# Patient Record
Sex: Female | Born: 2000 | Hispanic: Yes | Marital: Single
Health system: Southern US, Community
[De-identification: ages and names within clinical notes are randomized; demographics above are authoritative.]

## PROBLEM LIST (undated history)

## (undated) DIAGNOSIS — D649 Anemia, unspecified: Secondary | ICD-10-CM

## (undated) DIAGNOSIS — F419 Anxiety disorder, unspecified: Secondary | ICD-10-CM

## (undated) HISTORY — DX: Anemia, unspecified: D64.9

## (undated) HISTORY — DX: Anxiety disorder, unspecified: F41.9

---
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
## 2023-06-07 ENCOUNTER — Telehealth: Payer: Self-pay | Admitting: Family Medicine

## 2023-06-08 ENCOUNTER — Other Ambulatory Visit (HOSPITAL_COMMUNITY)
Admission: RE | Admit: 2023-06-08 | Discharge: 2023-06-08 | Disposition: A | Discharge status: Home / Self Care | Payer: Medicaid Other | Source: Ambulatory Visit | Attending: Family Medicine | Admitting: Family Medicine

## 2023-06-08 ENCOUNTER — Encounter: Payer: Self-pay | Admitting: Family Medicine

## 2023-06-08 ENCOUNTER — Ambulatory Visit: Payer: Medicaid Other | Admitting: Family Medicine

## 2023-06-08 VITALS — BP 107/74 | HR 91 | Ht 64.0 in | Wt 113.5 lb

## 2023-06-08 DIAGNOSIS — Z1159 Encounter for screening for other viral diseases: Secondary | ICD-10-CM | POA: Diagnosis not present

## 2023-06-08 DIAGNOSIS — Z113 Encounter for screening for infections with a predominantly sexual mode of transmission: Secondary | ICD-10-CM | POA: Diagnosis not present

## 2023-06-08 DIAGNOSIS — Z0001 Encounter for general adult medical examination with abnormal findings: Secondary | ICD-10-CM

## 2023-06-08 DIAGNOSIS — B3731 Acute candidiasis of vulva and vagina: Secondary | ICD-10-CM | POA: Diagnosis not present

## 2023-06-08 DIAGNOSIS — B9689 Other specified bacterial agents as the cause of diseases classified elsewhere: Secondary | ICD-10-CM | POA: Diagnosis not present

## 2023-06-08 DIAGNOSIS — Z01411 Encounter for gynecological examination (general) (routine) with abnormal findings: Secondary | ICD-10-CM | POA: Diagnosis not present

## 2023-06-08 DIAGNOSIS — N760 Acute vaginitis: Secondary | ICD-10-CM | POA: Diagnosis not present

## 2023-06-08 DIAGNOSIS — F411 Generalized anxiety disorder: Secondary | ICD-10-CM | POA: Diagnosis not present

## 2023-06-08 DIAGNOSIS — D508 Other iron deficiency anemias: Secondary | ICD-10-CM | POA: Insufficient documentation

## 2023-06-08 DIAGNOSIS — Z3009 Encounter for other general counseling and advice on contraception: Secondary | ICD-10-CM

## 2023-06-08 DIAGNOSIS — R0782 Intercostal pain: Secondary | ICD-10-CM

## 2023-06-08 DIAGNOSIS — Z0000 Encounter for general adult medical examination without abnormal findings: Secondary | ICD-10-CM | POA: Diagnosis not present

## 2023-06-08 DIAGNOSIS — Z114 Encounter for screening for human immunodeficiency virus [HIV]: Secondary | ICD-10-CM

## 2023-06-08 DIAGNOSIS — N898 Other specified noninflammatory disorders of vagina: Secondary | ICD-10-CM

## 2023-06-08 LAB — POCT URINE PREGNANCY: Preg Test, Ur: NEGATIVE

## 2023-06-08 MED ORDER — NORELGESTROMIN-ETH ESTRADIOL 150-35 MCG/24HR TD PTWK: MEDICATED_PATCH | TRANSDERMAL | 12 refills | Status: DC

## 2023-06-08 NOTE — Progress Notes (Signed)
New patient visit   Patient: Sharon Frye   DOB: 2000/07/22   22 y.o. Female  MRN: [id number] Visit Date: 06/08/2023  Today's healthcare provider: Sherlyn Hay, DO   Chief Complaint  Patient presents with   New Patient (Initial Visit)    Patient is wanting to wanting to do a phycial exam and possibly discuss her chest pains. Reports she has had always had a random stabbing pain in the sternum area of her chest since 14. She reports that she was always told it is some anxiety and she should just drink some water if she feels it come on suddenly.    Annual Exam    Last completed at 19 years and never completed a pap smear. Patient is sexually active.    Immunizations    Patient has consented to receiving influenza and tetanus vaccine as long as provider approves due to her cold symptoms.    Subjective    Sharon Frye is a 22 y.o. female who presents today as a new patient to establish care.  HPI HPI     New Patient (Initial Visit)    Additional comments: Patient is wanting to wanting to do a phycial exam and possibly discuss her chest pains. Reports she has had always had a random stabbing pain in the sternum area of her chest since 14. She reports that she was always told it is some anxiety and she should just drink some water if she feels it come on suddenly.         Annual Exam    Additional comments: Last completed at 19 years and never completed a pap smear. Patient is sexually active.         Immunizations    Additional comments: Patient has consented to receiving influenza and tetanus vaccine as long as provider approves due to her cold symptoms.       Last edited by Acey Lav, CMA on 06/08/2023  9:11 AM.      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 The primary concern is recurrent chest pain, which has been present since the age of [age] and has been previously attributed to anxiety. The pain is  described as a stabbing sensation in the chest, exacerbated by deep breaths. The patient also reports occasional anxiety, which has improved over time, but can still be triggered by certain situations.  The patient has a history of anemia and is currently taking iron supplements. She also reports a recent abortion earlier this year. The patient has been sexually active and uses condoms for contraception. She has previously tried various forms of birth control, including the pill and the shot, but found the patch to be the most effective. She expresses interest in exploring the patch as a contraceptive option again.  The patient reports recent symptoms of a cold, including sinus congestion, sneezing, and a runny nose. She experienced ear pain the previous day, but it has since resolved. She denies any fever, chills, fatigue, or other systemic symptoms.  The patient also reports a recent issue with vaginal discharge and dry, peeling skin on the labia, which has caused some discomfort and itching. She denies any pain during sex, blood in urine, or other urinary symptoms.  The patient's lifestyle is described as somewhat sedentary due to the nature of her work. Her diet primarily consists of eating out, often at fast-food restaurants, due to a busy work schedule. She acknowledges the need  for healthier dietary choices.  The patient has never been vaccinated for flu or tetanus and has declined the COVID-19 booster shot. She is open to being screened for HIV and Hepatitis C. She also agrees to a Pap smear and testing for sexually transmitted infections.   Past Medical History:  Diagnosis Date   Anemia    Anxiety    History reviewed. No pertinent surgical history. Family Status  Relation Name Status   Mother Weyman Rodney (Not Specified)   Father Sreeya Rossiter (Not Specified)   Pat Aunt Amarylis Mcbride (Not Specified)  No partnership data on file   Family History  Problem Relation Age of Onset    Alcohol abuse Mother    Depression Father    Drug abuse Father    Anxiety disorder Paternal Aunt    Miscarriages / Stillbirths Paternal Aunt    Social History   Socioeconomic History   Marital status: Single    Spouse name: Not on file   Number of children: Not on file   Years of education: Not on file   Highest education level: GED or equivalent  Occupational History   Not on file  Tobacco Use   Smoking status: Never   Smokeless tobacco: Never  Vaping Use   Vaping status: Never Used  Substance and Sexual Activity   Alcohol use: Yes   Drug use: Never   Sexual activity: Yes    Birth control/protection: Condom  Other Topics Concern   Not on file  Social History Narrative   Not on file   Social Determinants of Health   Financial Resource Strain: Low Risk  (06/04/2023)   Overall Financial Resource Strain (CARDIA)    Difficulty of Paying Living Expenses: Not very hard  Food Insecurity: No Food Insecurity (06/04/2023)   Hunger Vital Sign    Worried About Running Out of Food in the Last Year: Never true    Ran Out of Food in the Last Year: Never true  Transportation Needs: No Transportation Needs (06/04/2023)   PRAPARE - Administrator, Civil Service (Medical): No    Lack of Transportation (Non-Medical): No  Physical Activity: Insufficiently Active (06/04/2023)   Exercise Vital Sign    Days of Exercise per Week: 2 days    Minutes of Exercise per Session: 10 min  Stress: Stress Concern Present (06/04/2023)   [employer] of Occupational Health - Occupational Stress Questionnaire    Feeling of Stress : Rather much  Social Connections: Moderately Isolated (06/04/2023)   Social Connection and Isolation Panel [NHANES]    Frequency of Communication with Friends and Family: More than three times a week    Frequency of Social Gatherings with Friends and Family: Once a week    Attends Religious Services: More than 4 times per year    Active Member of [employer] or  Organizations: No    Attends Engineer, structural: Not on file    Marital Status: Never married   Outpatient Medications Prior to Visit  Medication Sig   ibuprofen (ADVIL) 400 MG tablet Take 1 tablet (400 mg total) by mouth every 6 (six) hours as needed.   [DISCONTINUED] Baclofen 5 MG TABS Take 5 mg by mouth 3 (three) times daily as needed. (Patient not taking: Reported on 06/08/2023)   No facility-administered medications prior to visit.   Allergies  Allergen Reactions   Penicillins Rash    Immunization History  Administered Date(s) Administered   DTaP 05/31/2002, 07/31/2002, 10/01/2002, 07/29/2003, 07/13/2006  HIB (PRP-OMP) 05/31/2002, 07/31/2002, 10/01/2002, 07/29/2003   HPV 9-valent 10/17/2012, 12/19/2012, 04/19/2013   Hepatitis A, Ped/Adol-2 Dose 09/16/2005, 07/13/2006   Hepatitis B, PED/ADOLESCENT 26-Dec-2001, 12/31/2002, 05/01/2005   IPV 05/31/2002, 07/31/2002, 12/31/2002   Influenza Nasal 05/15/2010, 06/09/2011, 04/19/2013   Influenza-Unspecified 07/29/2003, 07/13/2006   MMR 04/25/2003, 07/13/2006   Meningococcal B, OMV 05/03/2018, 06/13/2018   Meningococcal Mcv4o 10/31/2013, 05/03/2018   PFIZER(Purple Top)SARS-COV-2 Vaccination 04/28/2020, 05/19/2020   Pneumococcal Conjugate PCV 7 05/31/2002, 07/31/2002, 07/29/2003   Polio, Unspecified 07/13/2006   Tdap 10/17/2012   Varicella 04/25/2003    Health Maintenance  Topic Date Due   DTaP/Tdap/Td (7 - Td or Tdap) 06/22/2023 (Originally 10/18/2022)   INFLUENZA VACCINE  06/22/2023 (Originally 02/17/2023)   COVID-19 Vaccine (3 - 2023-24 season) 06/24/2023 (Originally 03/20/2023)   Cervical Cancer Screening (Pap smear)  06/07/2026   HPV VACCINES  Completed   Hepatitis C Screening  Completed   HIV Screening  Completed    Patient Care Team: Sherlyn Hay, DO as PCP - General (Family Medicine)  Review of Systems  Constitutional:  Negative for chills, fatigue and fever.  HENT:  Positive for congestion, rhinorrhea and  sneezing. Negative for ear pain, postnasal drip and sore throat.   Eyes: Negative.  Negative for pain and redness.  Respiratory:  Negative for cough, shortness of breath and wheezing.   Cardiovascular:  Negative for chest pain and leg swelling.  Gastrointestinal:  Negative for abdominal pain, blood in stool, constipation, diarrhea and nausea.  Endocrine: Negative for polydipsia and polyphagia.  Genitourinary:  Negative for dyspareunia, dysuria, flank pain, hematuria, pelvic pain, vaginal bleeding and vaginal discharge.  Musculoskeletal:  Negative for arthralgias, back pain, gait problem and joint swelling.  Skin:  Negative for rash.  Neurological:  Positive for headaches (yesterday; not an ongoing problem). Negative for dizziness, tremors, seizures, weakness, light-headedness and numbness.  Hematological:  Negative for adenopathy.  Psychiatric/Behavioral:  Negative for behavioral problems, confusion and dysphoric mood. The patient is nervous/anxious. The patient is not hyperactive.         Objective    BP 107/74 (BP Location: Left Arm, Patient Position: Sitting, Cuff Size: Normal)   Pulse 91   Ht 5\' 4"  (1.626 m)   Wt 113 lb 8 oz (51.5 kg)   LMP 05/26/2023   SpO2 100%   BMI 19.48 kg/m     Physical Exam Vitals and nursing note reviewed.  Constitutional:      General: She is awake.     Appearance: Normal appearance.  HENT:     Head: Normocephalic and atraumatic.     Right Ear: Tympanic membrane, ear canal and external ear normal.     Left Ear: Tympanic membrane, ear canal and external ear normal.     Nose: Nose normal.     Mouth/Throat:     Mouth: Mucous membranes are moist.     Pharynx: Oropharynx is clear. No oropharyngeal exudate or posterior oropharyngeal erythema.  Eyes:     General: No scleral icterus.    Extraocular Movements: Extraocular movements intact.     Conjunctiva/sclera: Conjunctivae normal.     Pupils: Pupils are equal, round, and reactive to light.   Neck:     Thyroid: No thyromegaly or thyroid tenderness.  Cardiovascular:     Rate and Rhythm: Normal rate and regular rhythm.     Pulses: Normal pulses.     Heart sounds: Normal heart sounds.  Pulmonary:     Effort: Pulmonary effort is normal. No tachypnea, bradypnea or  respiratory distress.     Breath sounds: Normal breath sounds. No stridor. No wheezing, rhonchi or rales.  Abdominal:     General: Bowel sounds are normal. There is no distension.     Palpations: Abdomen is soft. There is no mass.     Tenderness: There is no abdominal tenderness. There is no guarding.     Hernia: No hernia is present.  Genitourinary:    Exam position: Lithotomy position.     Pubic Area: No rash.      Labia:        Right: No rash, tenderness, lesion or injury.        Left: No rash, tenderness, lesion or injury.      Vagina: Vaginal discharge (copious white discharge) present.     Cervix: Discharge (small amount white discharge from os) and friability present. No cervical motion tenderness, lesion, erythema or cervical bleeding.     Uterus: Normal.      Adnexa: Right adnexa normal and left adnexa normal.     Comments: No malodor noted. Musculoskeletal:     Cervical back: Normal range of motion and neck supple.     Right lower leg: No edema.     Left lower leg: No edema.  Lymphadenopathy:     Cervical: No cervical adenopathy.  Skin:    General: Skin is warm and dry.  Neurological:     Mental Status: She is alert and oriented to person, place, and time. Mental status is at baseline.  Psychiatric:        Mood and Affect: Mood normal.        Behavior: Behavior normal.     Depression Screen    06/08/2023    9:12 AM  PHQ 2/9 Scores  PHQ - 2 Score 0  PHQ- 9 Score 8   Results for orders placed or performed in visit on 06/08/23  Comprehensive metabolic panel  Result Value Ref Range   Glucose 84 70 - 99 mg/dL   BUN 8 6 - 20 mg/dL   Creatinine, Ser 9.62 (L) 0.57 - 1.00 mg/dL   eGFR 952 >84  XL/KGM/0.10   BUN/Creatinine Ratio 15 9 - 23   Sodium 138 134 - 144 mmol/L   Potassium 4.2 3.5 - 5.2 mmol/L   Chloride 102 96 - 106 mmol/L   CO2 21 20 - 29 mmol/L   Calcium 9.4 8.7 - 10.2 mg/dL   Total Protein 7.3 6.0 - 8.5 g/dL   Albumin 4.7 4.0 - 5.0 g/dL   Globulin, Total 2.6 1.5 - 4.5 g/dL   Bilirubin Total 0.3 0.0 - 1.2 mg/dL   Alkaline Phosphatase 52 44 - 121 IU/L   AST 15 0 - 40 IU/L   ALT 11 0 - 32 IU/L  Lipid panel  Result Value Ref Range   Cholesterol, Total 170 100 - 199 mg/dL   Triglycerides 64 0 - 149 mg/dL   HDL 54 >27 mg/dL   VLDL Cholesterol Cal 12 5 - 40 mg/dL   LDL Chol Calc (NIH) 253 (H) 0 - 99 mg/dL   Chol/HDL Ratio 3.1 0.0 - 4.4 ratio  HIV Antibody (routine testing w rflx)  Result Value Ref Range   HIV Screen 4th Generation wRfx Non Reactive Non Reactive  HCV Ab w Reflex to Quant PCR  Result Value Ref Range   HCV Ab Non Reactive Non Reactive  CBC with Differential/Platelet  Result Value Ref Range   WBC 7.8 3.4 - 10.8 x10E3/uL   RBC  4.03 3.77 - 5.28 x10E6/uL   Hemoglobin 12.5 11.1 - 15.9 g/dL   Hematocrit 40.9 81.1 - 46.6 %   MCV 93 79 - 97 fL   MCH 31.0 26.6 - 33.0 pg   MCHC 33.5 31.5 - 35.7 g/dL   RDW 91.4 78.2 - 95.6 %   Platelets 263 150 - 450 x10E3/uL   Neutrophils 75 Not Estab. %   Lymphs 19 Not Estab. %   Monocytes 4 Not Estab. %   Eos 2 Not Estab. %   Basos 0 Not Estab. %   Neutrophils Absolute 5.9 1.4 - 7.0 x10E3/uL   Lymphocytes Absolute 1.5 0.7 - 3.1 x10E3/uL   Monocytes Absolute 0.3 0.1 - 0.9 x10E3/uL   EOS (ABSOLUTE) 0.1 0.0 - 0.4 x10E3/uL   Basophils Absolute 0.0 0.0 - 0.2 x10E3/uL   Immature Granulocytes 0 Not Estab. %   Immature Grans (Abs) 0.0 0.0 - 0.1 x10E3/uL  Interpretation:  Result Value Ref Range   HCV Interp 1: Comment   POCT urine pregnancy  Result Value Ref Range   Preg Test, Ur Negative Negative  Cervicovaginal ancillary only  Result Value Ref Range   Neisseria Gonorrhea Negative    Chlamydia Negative     Trichomonas Negative    Bacterial Vaginitis (gardnerella) Positive (A)    Candida Vaginitis Positive (A)    Candida Glabrata Negative    Comment      Normal Reference Range Bacterial Vaginosis - Negative   Comment Normal Reference Range Candida Species - Negative    Comment Normal Reference Range Candida Galbrata - Negative    Comment Normal Reference Range Trichomonas - Negative    Comment Normal Reference Ranger Chlamydia - Negative    Comment      Normal Reference Range Neisseria Gonorrhea - Negative  Cytology - PAP  Result Value Ref Range   High risk HPV Negative    Adequacy Satisfactory for evaluation.    Diagnosis (A)     - Atypical squamous cells of undetermined significance (ASC-US)   Comment Normal Reference Range HPV - Negative     Assessment & Plan     Encounter for medical examination to establish care Assessment & Plan: Physical exam overall unremarkable except as noted above. Routine lab work ordered as noted. Due for Pap smear and vaccinations (flu and tetanus). Discussed the importance of these screenings and vaccinations. Patient will return for these after recovering from current illness.   - Schedule follow-up appointment in two weeks for vaccinations   - Screen for HIV and hepatitis C   - Perform blood count to check for anemia.  Orders: -     Comprehensive metabolic panel -     Lipid panel  Generalized anxiety disorder Assessment & Plan: Anxiety with improvement over time. No current therapy or medication. Discussed potential benefits of therapy and medication, but patient feels she has it under control. Recommended non-pharmacological interventions such as meditation and yoga.   - Encourage meditation and yoga for anxiety management     Intercostal pain Assessment & Plan: Intermittent chest pain since age 64, likely related to anxiety. Described as a stabbing pain exacerbated by deep breaths, suggestive of costochondritis. Discussed the role of anxiety  in muscle tension and the importance of stretching and deep breathing exercises.   - Encourage meditation and deep breathing exercises   - Recommend stretching exercises to open the chest muscles     Iron deficiency anemia secondary to inadequate dietary iron intake -  CBC with Differential/Platelet  Encounter for counseling regarding contraception Assessment & Plan: Prefers the contraceptive patch, which has been used successfully in the past. Discussed other options including IUD and Nexplanon, but prefers the patch due to previous positive experience and concerns about implants. Discussed the IUD's long-term benefits and potential cramping/bleeding with both IUD and Nexplanon. Explained that the IUD is approved for nulliparous individuals and lasts up to 8 years.   - Prescribe contraceptive patch   - Confirm insurance coverage for the patch    Orders: -     POCT urine pregnancy -     Norelgestromin-Eth Estradiol; Place 1 patch onto the skin and leave in place for 7 days.  Change patch every 7 days for 21 days. Then go without a patch for 7 days.  Repeat every 28 days.  Dispense: 3 patch; Refill: 12  Encounter for screening for HIV -     HIV Antibody (routine testing w rflx)  Encounter for hepatitis C screening test for low risk patient -     HCV Ab w Reflex to Quant PCR -     Interpretation:  Encounter for well woman exam with abnormal findings -     Cervicovaginal ancillary only -     Cytology - PAP  Screening examination for sexually transmitted disease -     Cytology - PAP  Vaginal discharge  Vagina, candidiasis -     Cervicovaginal ancillary only    Return in about 2 weeks (around 06/22/2023) for Vaccine admin only; may add slot to allow for visit.     I discussed the assessment and treatment plan with the patient  The patient was provided an opportunity to ask questions and all were answered. The patient agreed with the plan and demonstrated an understanding of the  instructions.   The patient was advised to call back or seek an in-person evaluation if the symptoms worsen or if the condition fails to improve as anticipated.    Sherlyn Hay, DO  [facility] [phone] (phone) [phone] (fax)  [facility]

## 2023-06-09 LAB — CERVICOVAGINAL ANCILLARY ONLY
Bacterial Vaginitis (gardnerella): POSITIVE — AB
Candida Glabrata: NEGATIVE
Candida Vaginitis: POSITIVE — AB
Chlamydia: NEGATIVE
Comment: NEGATIVE
Comment: NEGATIVE
Comment: NEGATIVE
Comment: NEGATIVE
Comment: NEGATIVE
Comment: NORMAL
Neisseria Gonorrhea: NEGATIVE
Trichomonas: NEGATIVE

## 2023-06-09 LAB — CBC WITH DIFFERENTIAL/PLATELET
Basophils Absolute: 0 10*3/uL (ref 0.0–0.2)
Basos: 0 %
EOS (ABSOLUTE): 0.1 10*3/uL (ref 0.0–0.4)
Eos: 2 %
Hematocrit: 37.3 % (ref 34.0–46.6)
Hemoglobin: 12.5 g/dL (ref 11.1–15.9)
Immature Grans (Abs): 0 10*3/uL (ref 0.0–0.1)
Immature Granulocytes: 0 %
Lymphocytes Absolute: 1.5 10*3/uL (ref 0.7–3.1)
Lymphs: 19 %
MCH: 31 pg (ref 26.6–33.0)
MCHC: 33.5 g/dL (ref 31.5–35.7)
MCV: 93 fL (ref 79–97)
Monocytes Absolute: 0.3 10*3/uL (ref 0.1–0.9)
Monocytes: 4 %
Neutrophils Absolute: 5.9 10*3/uL (ref 1.4–7.0)
Neutrophils: 75 %
Platelets: 263 10*3/uL (ref 150–450)
RBC: 4.03 x10E6/uL (ref 3.77–5.28)
RDW: 11.9 % (ref 11.7–15.4)
WBC: 7.8 10*3/uL (ref 3.4–10.8)

## 2023-06-09 LAB — COMPREHENSIVE METABOLIC PANEL
ALT: 11 [IU]/L (ref 0–32)
AST: 15 [IU]/L (ref 0–40)
Albumin: 4.7 g/dL (ref 4.0–5.0)
Alkaline Phosphatase: 52 [IU]/L (ref 44–121)
BUN/Creatinine Ratio: 15 (ref 9–23)
BUN: 8 mg/dL (ref 6–20)
Bilirubin Total: 0.3 mg/dL (ref 0.0–1.2)
CO2: 21 mmol/L (ref 20–29)
Calcium: 9.4 mg/dL (ref 8.7–10.2)
Chloride: 102 mmol/L (ref 96–106)
Creatinine, Ser: 0.54 mg/dL — ABNORMAL LOW (ref 0.57–1.00)
Globulin, Total: 2.6 g/dL (ref 1.5–4.5)
Glucose: 84 mg/dL (ref 70–99)
Potassium: 4.2 mmol/L (ref 3.5–5.2)
Sodium: 138 mmol/L (ref 134–144)
Total Protein: 7.3 g/dL (ref 6.0–8.5)
eGFR: 134 mL/min/{1.73_m2} (ref >59)

## 2023-06-09 LAB — LIPID PANEL
Chol/HDL Ratio: 3.1 ratio (ref 0.0–4.4)
Cholesterol, Total: 170 mg/dL (ref 100–199)
HDL: 54 mg/dL (ref >39)
LDL Chol Calc (NIH): 104 mg/dL — ABNORMAL HIGH (ref 0–99)
Triglycerides: 64 mg/dL (ref 0–149)
VLDL Cholesterol Cal: 12 mg/dL (ref 5–40)

## 2023-06-09 LAB — HCV INTERPRETATION

## 2023-06-09 LAB — HIV ANTIBODY (ROUTINE TESTING W REFLEX): HIV Screen 4th Generation wRfx: NONREACTIVE

## 2023-06-09 LAB — HCV AB W REFLEX TO QUANT PCR: HCV Ab: NONREACTIVE

## 2023-06-14 LAB — CYTOLOGY - PAP
Comment: NEGATIVE
Diagnosis: UNDETERMINED — AB
High risk HPV: NEGATIVE

## 2023-06-21 ENCOUNTER — Encounter: Payer: Self-pay | Admitting: Family Medicine

## 2023-06-21 DIAGNOSIS — R0782 Intercostal pain: Secondary | ICD-10-CM | POA: Insufficient documentation

## 2023-06-21 MED ORDER — METRONIDAZOLE 500 MG PO TABS: 500.0000 mg | ORAL_TABLET | Freq: Two times a day (BID) | ORAL | 0 refills | Status: DC

## 2023-06-21 MED ORDER — FLUCONAZOLE 100 MG PO TABS: 100.0000 mg | ORAL_TABLET | Freq: Every day | ORAL | 0 refills | Status: DC

## 2023-06-21 NOTE — Assessment & Plan Note (Signed)
Anxiety with improvement over time. No current therapy or medication. Discussed potential benefits of therapy and medication, but patient feels she has it under control. Recommended non-pharmacological interventions such as meditation and yoga.   - Encourage meditation and yoga for anxiety management

## 2023-06-21 NOTE — Assessment & Plan Note (Signed)
Intermittent chest pain since age 22, likely related to anxiety. Described as a stabbing pain exacerbated by deep breaths, suggestive of costochondritis. Discussed the role of anxiety in muscle tension and the importance of stretching and deep breathing exercises.   - Encourage meditation and deep breathing exercises   - Recommend stretching exercises to open the chest muscles

## 2023-06-21 NOTE — Assessment & Plan Note (Addendum)
Physical exam overall unremarkable except as noted above. Routine lab work ordered as noted. Due for Pap smear and vaccinations (flu and tetanus). Discussed the importance of these screenings and vaccinations. Patient will return for these after recovering from current illness.   - Schedule follow-up appointment in two weeks for vaccinations   - Screen for HIV and hepatitis C   - Perform blood count to check for anemia.

## 2023-06-21 NOTE — Assessment & Plan Note (Signed)
Prefers the contraceptive patch, which has been used successfully in the past. Discussed other options including IUD and Nexplanon, but prefers the patch due to previous positive experience and concerns about implants. Discussed the IUD's long-term benefits and potential cramping/bleeding with both IUD and Nexplanon. Explained that the IUD is approved for nulliparous individuals and lasts up to 8 years.   - Prescribe contraceptive patch   - Confirm insurance coverage for the patch

## 2023-06-21 NOTE — Addendum Note (Signed)
Addended by: Jacquenette Shone on: 06/21/2023 03:47 PM   Modules accepted: Orders

## 2023-06-22 ENCOUNTER — Ambulatory Visit (INDEPENDENT_AMBULATORY_CARE_PROVIDER_SITE_OTHER): Payer: Medicaid Other | Admitting: Family Medicine

## 2023-06-22 ENCOUNTER — Ambulatory Visit: Payer: Medicaid Other | Admitting: Family Medicine

## 2023-06-22 ENCOUNTER — Encounter: Payer: Self-pay | Admitting: Family Medicine

## 2023-06-22 VITALS — BP 111/77 | HR 85 | Wt 114.0 lb

## 2023-06-22 DIAGNOSIS — Z23 Encounter for immunization: Secondary | ICD-10-CM | POA: Diagnosis not present

## 2023-06-22 DIAGNOSIS — F419 Anxiety disorder, unspecified: Secondary | ICD-10-CM

## 2023-06-22 DIAGNOSIS — F411 Generalized anxiety disorder: Secondary | ICD-10-CM | POA: Diagnosis not present

## 2023-06-22 NOTE — Assessment & Plan Note (Addendum)
Pt requesting referral to ambulatory psychiatry for cognitive behavioral therapy as she discussed with her PCP, Dr. Payton Mccallum. Last GAD 7 was 18 (severe anxiety) - no emergent needs today per pt.   Discussed options for medications as well, pt would like to hold off and first start with therapy.   Order placed.

## 2023-06-22 NOTE — Progress Notes (Signed)
Established Patient Office Visit  Introduced to nurse practitioner role and practice setting.  All questions answered.  Discussed provider/patient relationship and expectations.  Subjective   Patient ID: Sharon Frye, female    DOB: 04/04/2001  Age: 22 y.o. MRN: [id number]  Chief Complaint  Patient presents with   Immunizations    Flu and Tetanus    Pt presents to clinic for Flu and Tdap vaccines. Was seen by PCP, Dr. Payton Mccallum, two weeks ago - held off on vaccines given cold symptoms at the time. Administering today. Pt also interested in getting referral for cognitive behavioral therapy, as discussed during her last visit.   Denies any suicidal, homicidal, or emergent behavioral health needs today.        06/08/2023    9:12 AM  Depression screen PHQ 2/9  Decreased Interest 0  Down, Depressed, Hopeless 0  PHQ - 2 Score 0  Altered sleeping 1  Tired, decreased energy 1  Change in appetite 2  Feeling bad or failure about yourself  1  Trouble concentrating 2  Moving slowly or fidgety/restless 1  Suicidal thoughts 0  PHQ-9 Score 8  Difficult doing work/chores Somewhat difficult       06/08/2023    9:12 AM  GAD 7 : Generalized Anxiety Score  Nervous, Anxious, on Edge 2  Control/stop worrying 3  Worry too much - different things 3  Trouble relaxing 2  Restless 2  Easily annoyed or irritable 3  Afraid - awful might happen 3  Total GAD 7 Score 18  Anxiety Difficulty Somewhat difficult   Review of Systems  All other systems reviewed and are negative.    Objective:     BP 111/77   Pulse 85   Wt 114 lb (51.7 kg)   LMP 05/26/2023 (Approximate)   SpO2 100%   BMI 19.57 kg/m    Physical Exam Constitutional:      Appearance: Normal appearance. She is normal weight.  HENT:     Head: Normocephalic.  Cardiovascular:     Rate and Rhythm: Normal rate and regular rhythm.     Pulses: Normal pulses.     Heart sounds: Normal heart sounds.  Pulmonary:     Effort:  Pulmonary effort is normal.     Breath sounds: Normal breath sounds.  Skin:    Capillary Refill: Capillary refill takes less than 2 seconds.  Neurological:     General: No focal deficit present.     Mental Status: She is alert and oriented to person, place, and time. Mental status is at baseline.  Psychiatric:        Mood and Affect: Mood normal.        Behavior: Behavior normal.        Thought Content: Thought content normal.        Judgment: Judgment normal.    Flowsheet Row Office Visit from 06/08/2023 in [facility]  PHQ-9 Total Score 8          06/08/2023    9:12 AM  GAD 7 : Generalized Anxiety Score  Nervous, Anxious, on Edge 2  Control/stop worrying 3  Worry too much - different things 3  Trouble relaxing 2  Restless 2  Easily annoyed or irritable 3  Afraid - awful might happen 3  Total GAD 7 Score 18  Anxiety Difficulty Somewhat difficult     No results found for any visits on 06/22/23.      Assessment & Plan:  Generalized  anxiety disorder Assessment & Plan: Pt requesting referral to ambulatory psychiatry for cognitive behavioral therapy. Last GAD 7 was 18 (severe anxiety).   Discussed options for medications as well, pt would like to hold off and first start with therapy.   Order placed.    Need for Tdap vaccination -     Tdap vaccine greater than or equal to 7yo IM  Need for influenza vaccination -     Flu vaccine trivalent PF, 6mos and older(Flulaval,Afluria,Fluarix,Fluzone)  Anxiety -     Ambulatory referral to Psychiatry      Return if symptoms worsen or fail to improve, for annual physical.   I, Sallee Provencal, FNP, have reviewed all documentation for this visit. The documentation on 06/22/23 for the exam, diagnosis, procedures, and orders are all accurate and complete.   Sallee Provencal, FNP

## 2023-08-31 ENCOUNTER — Encounter: Payer: Self-pay | Admitting: Family Medicine

## 2023-09-02 ENCOUNTER — Ambulatory Visit: Payer: Self-pay

## 2023-09-02 NOTE — Telephone Encounter (Signed)
  Chief Complaint: Thinks she may have flu - wants flu testing Symptoms: as below Frequency: yesterday Pertinent Negatives: Patient denies  Disposition: [] ED /[] Urgent Care (no appt availability in office) / [] Appointment(In office/virtual)/ []  [facility]/ [x] Home Care/ [] Refused Recommended Disposition /[]  Mobile Bus/ []  Follow-up with PCP Additional Notes: Pt thinks she may have the flu and desires testing. Pt will go to pharmacy and p/u flu/covid test. Pt will call back after testing for further advice. Pt may wish to start antiviral medications.   Summary: Fever, runny nose, cough   Patient states that she has had a fever, runny nose, cough and ear ache for 1 day. Patient is requesting a flu test before she goes to work at 11 this morning. Please advise.     Reason for Disposition  Influenza and antiviral drugs, questions about  Answer Assessment - Initial Assessment Questions 1. TYPE of EXPOSURE: "How were you exposed?" (e.g., close contact, not a close contact)     S/S came on suddenly yesterday. Pt is seeking flu test.  5. SYMPTOMS: "Do you have any symptoms?" (e.g., cough, fever, sore throat, difficulty breathing).     Fever, ear ache, runny nose.  Protocols used: Influenza Exposure-A-AH

## 2023-10-20 ENCOUNTER — Other Ambulatory Visit: Payer: Self-pay

## 2023-10-20 ENCOUNTER — Ambulatory Visit
Admission: RE | Admit: 2023-10-20 | Discharge: 2023-10-20 | Disposition: A | Discharge status: Home / Self Care | Source: Ambulatory Visit | Attending: Emergency Medicine | Admitting: Emergency Medicine

## 2023-10-20 VITALS — BP 114/79 | HR 75 | Temp 99.7°F | Resp 16

## 2023-10-20 DIAGNOSIS — S31139A Puncture wound of abdominal wall without foreign body, unspecified quadrant without penetration into peritoneal cavity, initial encounter: Secondary | ICD-10-CM | POA: Diagnosis not present

## 2023-10-20 DIAGNOSIS — S30861A Insect bite (nonvenomous) of abdominal wall, initial encounter: Secondary | ICD-10-CM | POA: Diagnosis not present

## 2023-10-20 DIAGNOSIS — W57XXXA Bitten or stung by nonvenomous insect and other nonvenomous arthropods, initial encounter: Secondary | ICD-10-CM

## 2023-10-20 MED ORDER — TRIAMCINOLONE ACETONIDE 0.1 % EX CREA: 1.0000 | TOPICAL_CREAM | Freq: Two times a day (BID) | CUTANEOUS | 0 refills | Status: DC

## 2023-10-20 NOTE — Discharge Instructions (Signed)
 Your evaluated for your insect bite which appears to be an allergic reaction but at this time no signs of infection  Apply triamcinolone cream twice daily to the affected area until healed  Please monitor for signs of infection such as worsening redness, pain, drainage or fever

## 2023-10-20 NOTE — ED Triage Notes (Signed)
 Patient presents to [facility] for evaluation after she felt something bite her on her left abdomen.  She looked down and there was a tick on the couch, but not attached to her.  Ever since, she has had redness, itchiness to the area.

## 2023-10-20 NOTE — ED Provider Notes (Signed)
 Renaldo Fiddler    CSN: [id number] Arrival date & time: 10/20/23  1159      History   Chief Complaint Chief Complaint  Patient presents with   Insect Bite    HPI Sharon Frye is a 23 y.o. female.   She presents for evaluation of erythema and pruritus present to the left flank after tick bite occurring 2 days ago.  Able to remove tick in its entirety.  Area mildly swollen.  Denies drainage and fever.   Past Medical History:  Diagnosis Date   Anemia    Anxiety     Patient Active Problem List   Diagnosis Date Noted   Intercostal pain 06/21/2023   Encounter for medical examination to establish care 06/08/2023   Generalized anxiety disorder 06/08/2023   Iron deficiency anemia secondary to inadequate dietary iron intake 06/08/2023   Encounter for counseling regarding contraception 06/08/2023    History reviewed. No pertinent surgical history.  OB History   No obstetric history on file.      Home Medications    Prior to Admission medications   Medication Sig Start Date End Date Taking? Authorizing Provider  triamcinolone cream (KENALOG) 0.1 % Apply 1 Application topically 2 (two) times daily. 10/20/23  Yes Charlesa Ehle R, NP  fluconazole (DIFLUCAN) 100 MG tablet Take 1 tablet (100 mg total) by mouth daily. WAIT and take AFTER completing metronidazole. Patient not taking: Reported on 06/22/2023 06/21/23   Sherlyn Hay, DO  ibuprofen (ADVIL) 400 MG tablet Take 1 tablet (400 mg total) by mouth every 6 (six) hours as needed. Patient not taking: Reported on 06/22/2023 04/11/20   Enid Derry, PA-C  metroNIDAZOLE (FLAGYL) 500 MG tablet Take 1 tablet (500 mg total) by mouth 2 (two) times daily. Patient not taking: Reported on 06/22/2023 06/21/23   Sherlyn Hay, DO  norelgestromin-ethinyl estradiol [employer]) 150-35 MCG/24HR transdermal patch Place 1 patch onto the skin and leave in place for 7 days.  Change patch every 7 days for 21 days. Then go without a patch for 7  days.  Repeat every 28 days. 06/08/23   Sherlyn Hay, DO    Family History Family History  Problem Relation Age of Onset   Alcohol abuse Mother    Depression Father    Drug abuse Father    Anxiety disorder Paternal Aunt    Miscarriages / Stillbirths Paternal Aunt     Social History Social History   Tobacco Use   Smoking status: Never   Smokeless tobacco: Never  Vaping Use   Vaping status: Never Used  Substance Use Topics   Alcohol use: Yes   Drug use: Never     Allergies   Penicillins   Review of Systems Review of Systems   Physical Exam Triage Vital Signs ED Triage Vitals [10/20/23 1210]  Encounter Vitals Group     BP 114/79     Systolic BP Percentile      Diastolic BP Percentile      Pulse Rate 75     Resp 16     Temp 99.7 F (37.6 C)     Temp Source Oral     SpO2 98 %     Weight      Height      Head Circumference      Peak Flow      Pain Score 0     Pain Loc      Pain Education      Exclude from [employer]  Chart    No data found.  Updated Vital Signs BP 114/79 (BP Location: Left Arm)   Pulse 75   Temp 99.7 F (37.6 C) (Oral)   Resp 16   LMP 09/29/2023   SpO2 98%   Visual Acuity Right Eye Distance:   Left Eye Distance:   Bilateral Distance:    Right Eye Near:   Left Eye Near:    Bilateral Near:     Physical Exam Constitutional:      Appearance: Normal appearance.  Eyes:     Extraocular Movements: Extraocular movements intact.  Pulmonary:     Effort: Pulmonary effort is normal.  Skin:    Comments: Less than 0.5 cm puncture present to the left flank with surrounding erythema, nontender, no drainage noted  Neurological:     Mental Status: She is alert and oriented to person, place, and time. Mental status is at baseline.      UC Treatments / Results  Labs (all labs ordered are listed, but only abnormal results are displayed) Labs Reviewed - No data to display  EKG   Radiology No results  found.  Procedures Procedures (including critical care time)  Medications Ordered in UC Medications - No data to display  Initial Impression / Assessment and Plan / UC Course  I have reviewed the triage vital signs and the nursing notes.  Pertinent labs & imaging results that were available during my care of the patient were reviewed by me and considered in my medical decision making (see chart for details).  Puncture wound of abdomen, initial encounter, tick bite of left flank, initial encounter  No signs of infection, appears to be inflammatory reaction, will defer oral steroids at this time, prescribed triamcinolone cream and recommended topical or oral antihistamines for further management of itching, advised against long exposure to heat, may follow-up as needed Final Clinical Impressions(s) / UC Diagnoses   Final diagnoses:  Tick bite of left flank, initial encounter  Puncture wound of abdomen, initial encounter     Discharge Instructions      Your evaluated for your insect bite which appears to be an allergic reaction but at this time no signs of infection  Apply triamcinolone cream twice daily to the affected area until healed  Please monitor for signs of infection such as worsening redness, pain, drainage or fever     ED Prescriptions     Medication Sig Dispense Auth. Provider   triamcinolone cream (KENALOG) 0.1 % Apply 1 Application topically 2 (two) times daily. 30 g Valinda Hoar, NP      PDMP not reviewed this encounter.   Valinda Hoar, [place] 10/20/23 [phone]

## 2024-07-16 ENCOUNTER — Encounter: Payer: Self-pay | Admitting: Family Medicine

## 2024-07-16 ENCOUNTER — Ambulatory Visit (INDEPENDENT_AMBULATORY_CARE_PROVIDER_SITE_OTHER): Admitting: Family Medicine

## 2024-07-16 ENCOUNTER — Other Ambulatory Visit (HOSPITAL_COMMUNITY)
Admission: RE | Admit: 2024-07-16 | Discharge: 2024-07-16 | Disposition: A | Discharge status: Home / Self Care | Source: Ambulatory Visit | Attending: Family Medicine | Admitting: Family Medicine

## 2024-07-16 VITALS — BP 107/74 | HR 82 | Temp 98.6°F | Ht 64.0 in | Wt 123.6 lb

## 2024-07-16 DIAGNOSIS — R002 Palpitations: Secondary | ICD-10-CM | POA: Diagnosis not present

## 2024-07-16 DIAGNOSIS — Z23 Encounter for immunization: Secondary | ICD-10-CM

## 2024-07-16 DIAGNOSIS — R079 Chest pain, unspecified: Secondary | ICD-10-CM

## 2024-07-16 DIAGNOSIS — R202 Paresthesia of skin: Secondary | ICD-10-CM | POA: Diagnosis not present

## 2024-07-16 DIAGNOSIS — F411 Generalized anxiety disorder: Secondary | ICD-10-CM | POA: Diagnosis not present

## 2024-07-16 DIAGNOSIS — Z118 Encounter for screening for other infectious and parasitic diseases: Secondary | ICD-10-CM

## 2024-07-16 DIAGNOSIS — Z136 Encounter for screening for cardiovascular disorders: Secondary | ICD-10-CM

## 2024-07-16 MED ORDER — ESCITALOPRAM OXALATE 10 MG PO TABS: 10.0000 mg | ORAL_TABLET | Freq: Every day | ORAL | 1 refills | Status: AC

## 2024-07-16 MED ORDER — HYDROXYZINE HCL 10 MG PO TABS: 10.0000 mg | ORAL_TABLET | Freq: Three times a day (TID) | ORAL | 0 refills | Status: AC | PRN

## 2024-07-16 NOTE — Progress Notes (Addendum)
 "     Established patient visit   Patient: Sharon Frye   DOB: 06/05/2001   23 y.o. Female  MRN: [id number] Visit Date: 07/16/2024  Today's healthcare provider: LAURAINE LOISE BUOY, DO   Chief Complaint  Patient presents with   Acute Visit    Patient states that she is having chest pains within the last few months, seems like it is happening 2-3 times a week.  Feels like that her heart/chest is getting tighter each time she breaths in, discomfort feeling like her heart is going to explode.  States that she isn't doing anything specific but when it does happen she feels as though she can't move and has to get her breathing right.  That's the only way she can get her chest to stop hurting is by taking deep breaths.  Also numbness in right leg.   Subjective    HPI Sharon Frye is a 23 year old female with a history of anxiety who presents with chest pain and associated anxiety symptoms.  She experiences chest pain two to three times a week for the last couple of months, typically located in the middle of her chest (over the sternum). Deep breaths help alleviate the pain. During these episodes, she has difficulty breathing, describing it as feeling like she 'can't breathe' and needing to take 'real small' breaths.   Her chest pain often coincides with periods of increased anxiety. She has a history of anxiety and was previously referred to psychiatry but did not attend.  She reports numbness in her right leg occurring three times, typically when standing up or bending down. She believes this is separate from her chest pain. No back pain is noted.  No symptoms of coughing, sneezing, runny nose, sore throat, or itchy eyes. She denies any family history of early heart problems but mentions a family history of diabetes, specifically her mother.      Medications: Show/hide medication list[1]  Review of Systems  HENT:  Negative for congestion, rhinorrhea and sore throat.   Respiratory:   Positive for shortness of breath. Negative for cough.   Cardiovascular:  Positive for chest pain. Negative for palpitations.        Objective    BP 107/74 (BP Location: Right Arm, Patient Position: Sitting, Cuff Size: Normal)   Pulse 82   Temp 98.6 F (37 C) (Oral)   Ht 5' 4 (1.626 m)   Wt 123 lb 9.6 oz (56.1 kg)   SpO2 100%   BMI 21.22 kg/m     Physical Exam Constitutional:      Appearance: Normal appearance.  HENT:     Head: Normocephalic and atraumatic.  Eyes:     General: No scleral icterus.    Extraocular Movements: Extraocular movements intact.     Conjunctiva/sclera: Conjunctivae normal.  Cardiovascular:     Rate and Rhythm: Normal rate and regular rhythm.     Pulses: Normal pulses.     Heart sounds: Normal heart sounds.  Pulmonary:     Effort: Pulmonary effort is normal. No respiratory distress.     Breath sounds: Normal breath sounds.  Abdominal:     General: Bowel sounds are normal. There is no distension.     Palpations: Abdomen is soft. There is no mass.     Tenderness: There is no abdominal tenderness. There is no guarding.  Musculoskeletal:     Right lower leg: No edema.     Left lower leg: No edema.  Skin:  General: Skin is warm and dry.  Neurological:     Mental Status: She is alert and oriented to person, place, and time. Mental status is at baseline.  Psychiatric:        Mood and Affect: Mood normal.        Behavior: Behavior normal.      No results found for any visits on 07/16/24.  Assessment & Plan    Chest pain, unspecified type -     EKG 12-Lead -     Comprehensive metabolic panel with GFR -     TSH Rfx on Abnormal to Free T4 -     CBC -     Troponin T  Palpitation -     TSH Rfx on Abnormal to Free T4  Generalized anxiety disorder -     Ambulatory referral to Psychology -     Escitalopram  Oxalate; Take 1 tablet (10 mg total) by mouth daily.  Dispense: 30 tablet; Refill: 1 -     hydrOXYzine  HCl; Take 1 tablet (10 mg total)  by mouth 3 (three) times daily as needed.  Dispense: 30 tablet; Refill: 0  Paresthesia -     VITAMIN D  25 Hydroxy (Vit-D Deficiency, Fractures) -     Vitamin B12  Encounter for screening for cardiovascular disorders -     Lipid panel  Need for influenza vaccination -     Flu vaccine trivalent PF, 6mos and older(Flulaval,Afluria,Fluarix,Fluzone)  Screening for chlamydial disease -     Cervicovaginal ancillary only     Chest pain, unspecified type; palpitation Intermittent chest pain likely anxiety-related given normal EKG and young age. - EKG showed normal sinus rhythm without ST-elevation, ectopy or ST changes, with normal axis. - Ordered blood work including cardiac markers to rule out other causes. - Will check troponin T to further rule out cardiac source of pain. - Address anxiety today as a suspected cause of chest pain.  Generalized anxiety disorder Anxiety likely contributing to chest pain. Discussed medication options and importance of communication regarding adverse effects. - Prescribed low dose daily medication for anxiety (escitalopram  10 mg daily). - Prescribed hydroxyzine  10 mg 3 times daily as needed for acute anxiety episodes. - Referred to counseling for anxiety management.  Paresthesia Intermittent numbness in right leg, considered separate from chest pain.  Denies back pain.  Will evaluate for metabolic sources with blood work as noted.  General Health Maintenance Routine health maintenance discussed. Family history of diabetes noted. - Screen for diabetes using blood sugar on metabolic panel. - Performed self-swab for chlamydia screening. - Plan for Pap smear at next visit.    Return in about 6 weeks (around 08/27/2024) for Pap, Anx/Dep.      I discussed the assessment and treatment plan with the patient  The patient was provided an opportunity to ask questions and all were answered. The patient agreed with the plan and demonstrated an understanding of  the instructions.   The patient was advised to call back or seek an in-person evaluation if the symptoms worsen or if the condition fails to improve as anticipated.    LAURAINE LOISE BUOY, DO  [facility] [phone] (phone) [phone] (fax)  [facility]     [1]  Outpatient Medications Prior to Visit  Medication Sig   [DISCONTINUED] fluconazole  (DIFLUCAN ) 100 MG tablet Take 1 tablet (100 mg total) by mouth daily. WAIT and take AFTER completing metronidazole . (Patient not taking: Reported on 06/22/2023)   [DISCONTINUED]  ibuprofen  (ADVIL ) 400 MG tablet Take 1 tablet (400 mg total) by mouth every 6 (six) hours as needed. (Patient not taking: Reported on 06/22/2023)   [DISCONTINUED] metroNIDAZOLE  (FLAGYL ) 500 MG tablet Take 1 tablet (500 mg total) by mouth 2 (two) times daily. (Patient not taking: Reported on 06/22/2023)   [DISCONTINUED] norelgestromin -ethinyl estradiol  (XULANE) 150-35 MCG/24HR transdermal patch Place 1 patch onto the skin and leave in place for 7 days.  Change patch every 7 days for 21 days. Then go without a patch for 7 days.  Repeat every 28 days.   [DISCONTINUED] triamcinolone  cream (KENALOG ) 0.1 % Apply 1 Application topically 2 (two) times daily.   No facility-administered medications prior to visit.   "

## 2024-07-17 LAB — COMPREHENSIVE METABOLIC PANEL WITH GFR
ALT: 45 IU/L — ABNORMAL HIGH (ref 0–32)
AST: 26 IU/L (ref 0–40)
Albumin: 4.6 g/dL (ref 4.0–5.0)
Alkaline Phosphatase: 46 IU/L (ref 41–116)
BUN/Creatinine Ratio: 21 (ref 9–23)
BUN: 10 mg/dL (ref 6–20)
Bilirubin Total: 0.3 mg/dL (ref 0.0–1.2)
CO2: 20 mmol/L (ref 20–29)
Calcium: 9.2 mg/dL (ref 8.7–10.2)
Chloride: 104 mmol/L (ref 96–106)
Creatinine, Ser: 0.47 mg/dL — ABNORMAL LOW (ref 0.57–1.00)
Globulin, Total: 2.4 g/dL (ref 1.5–4.5)
Glucose: 87 mg/dL (ref 70–99)
Potassium: 4.2 mmol/L (ref 3.5–5.2)
Sodium: 140 mmol/L (ref 134–144)
Total Protein: 7 g/dL (ref 6.0–8.5)
eGFR: 138 mL/min/1.73

## 2024-07-17 LAB — LIPID PANEL
Chol/HDL Ratio: 4.4 ratio (ref 0.0–4.4)
Cholesterol, Total: 192 mg/dL (ref 100–199)
HDL: 44 mg/dL
LDL Chol Calc (NIH): 134 mg/dL — ABNORMAL HIGH (ref 0–99)
Triglycerides: 76 mg/dL (ref 0–149)
VLDL Cholesterol Cal: 14 mg/dL (ref 5–40)

## 2024-07-17 LAB — CERVICOVAGINAL ANCILLARY ONLY
Chlamydia: NEGATIVE
Comment: NEGATIVE
Comment: NORMAL
Neisseria Gonorrhea: NEGATIVE

## 2024-07-17 LAB — CBC
Hematocrit: 38.1 % (ref 34.0–46.6)
Hemoglobin: 12.8 g/dL (ref 11.1–15.9)
MCH: 30.5 pg (ref 26.6–33.0)
MCHC: 33.6 g/dL (ref 31.5–35.7)
MCV: 91 fL (ref 79–97)
Platelets: 268 x10E3/uL (ref 150–450)
RBC: 4.2 x10E6/uL (ref 3.77–5.28)
RDW: 12 % (ref 11.7–15.4)
WBC: 6.3 x10E3/uL (ref 3.4–10.8)

## 2024-07-17 LAB — TSH RFX ON ABNORMAL TO FREE T4: TSH: 1.3 u[IU]/mL (ref 0.450–4.500)

## 2024-07-17 LAB — VITAMIN B12: Vitamin B-12: 489 pg/mL (ref 232–1245)

## 2024-07-17 LAB — VITAMIN D 25 HYDROXY (VIT D DEFICIENCY, FRACTURES): Vit D, 25-Hydroxy: 13 ng/mL — ABNORMAL LOW (ref 30.0–100.0)

## 2024-07-17 LAB — TROPONIN T: Troponin T (Highly Sensitive): <6 ng/L (ref 0–14)

## 2024-07-28 ENCOUNTER — Ambulatory Visit: Payer: Self-pay | Admitting: Family Medicine

## 2024-07-28 DIAGNOSIS — E559 Vitamin D deficiency, unspecified: Secondary | ICD-10-CM

## 2024-07-28 MED ORDER — VITAMIN D (ERGOCALCIFEROL) 1.25 MG (50000 UNIT) PO CAPS: 50000.0000 [IU] | ORAL_CAPSULE | ORAL | 1 refills | Status: AC

## 2024-08-03 ENCOUNTER — Ambulatory Visit
Admission: EM | Admit: 2024-08-03 | Discharge: 2024-08-03 | Disposition: A | Discharge status: Home / Self Care | Attending: Emergency Medicine | Admitting: Emergency Medicine

## 2024-08-03 ENCOUNTER — Encounter: Payer: Self-pay | Admitting: Emergency Medicine

## 2024-08-03 DIAGNOSIS — H6501 Acute serous otitis media, right ear: Secondary | ICD-10-CM | POA: Diagnosis not present

## 2024-08-03 MED ORDER — CEFDINIR 300 MG PO CAPS: 300.0000 mg | ORAL_CAPSULE | Freq: Two times a day (BID) | ORAL | 0 refills | Status: DC

## 2024-08-03 NOTE — Discharge Instructions (Addendum)
 Today you are being treated for an infection of the eardrum on the right side  Take cefdinir  twice daily for 7 days, you should begin to see improvement after 48 hours of medication use and then it should progressively get better  You may use Tylenol or ibuprofen  for management of discomfort  May hold warm compresses to the ear for additional comfort  Please not attempted any ear cleaning or object or fluid placement into the ear canal to prevent further irritation  For cough: honey 1/2 to 1 teaspoon (you can dilute the honey in water or another fluid).  You can also use guaifenesin and dextromethorphan for cough. You can use a humidifier for chest congestion and cough.  If you don't have a humidifier, you can sit in the bathroom with the hot shower running.      For sore throat: try warm salt water gargles, cepacol lozenges, throat spray, warm tea or water with lemon/honey, popsicles or ice, or OTC cold relief medicine for throat discomfort.   For congestion: take a daily anti-histamine like Zyrtec, Claritin, and a oral decongestant, such as pseudoephedrine.  You can also use Flonase 1-2 sprays in each nostril daily.   It is important to stay hydrated: drink plenty of fluids (water, gatorade/powerade/pedialyte, juices, or teas) to keep your throat moisturized and help further relieve irritation/discomfort.

## 2024-08-03 NOTE — ED Triage Notes (Signed)
 Patient complains right ear pain and decrease hearing x 1 day. Patient has not taken anything for symptoms. Rates  pain 7/10.

## 2024-08-03 NOTE — ED Provider Notes (Signed)
 " CAY RALPH PELT    CSN: [id number] Arrival date & time: 08/03/24  1201      History   Chief Complaint Chief Complaint  Patient presents with   Otalgia    HPI Sharon Frye is a 24 y.o. female.   Patient presents for evaluation of congestion, right-sided ear pain, sore throat and a nonproductive cough beginning 1 day ago.  Decreased hearing to the right side.  Tolerable to food and liquids.  No known sick contacts.  Has attempted use of NyQuil.   Past Medical History:  Diagnosis Date   Anemia    Anxiety     Patient Active Problem List   Diagnosis Date Noted   Intercostal pain 06/21/2023   Encounter for medical examination to establish care 06/08/2023   Generalized anxiety disorder 06/08/2023   Iron deficiency anemia secondary to inadequate dietary iron intake 06/08/2023   Encounter for counseling regarding contraception 06/08/2023    History reviewed. No pertinent surgical history.  OB History   No obstetric history on file.      Home Medications    Prior to Admission medications  Medication Sig Start Date End Date Taking? Authorizing Provider  escitalopram  (LEXAPRO ) 10 MG tablet Take 1 tablet (10 mg total) by mouth daily. 07/16/24   Donzella Lauraine SAILOR, DO  hydrOXYzine  (ATARAX ) 10 MG tablet Take 1 tablet (10 mg total) by mouth 3 (three) times daily as needed. 07/16/24   Pardue, Lauraine SAILOR, DO  Vitamin D , Ergocalciferol , (DRISDOL ) 1.25 MG (50000 UNIT) CAPS capsule Take 1 capsule (50,000 Units total) by mouth every 7 (seven) days. 07/28/24   Donzella Lauraine SAILOR, DO    Family History Family History  Problem Relation Age of Onset   Alcohol abuse Mother    Depression Father    Drug abuse Father    Anxiety disorder Paternal Aunt    Miscarriages / Stillbirths Paternal Aunt     Social History Social History[1]   Allergies   Penicillins   Review of Systems Review of Systems   Physical Exam Triage Vital Signs ED Triage Vitals  Encounter Vitals Group      BP 08/03/24 1257 112/78     Girls Systolic BP Percentile --      Girls Diastolic BP Percentile --      Boys Systolic BP Percentile --      Boys Diastolic BP Percentile --      Pulse Rate 08/03/24 1257 93     Resp 08/03/24 1257 18     Temp 08/03/24 1257 98 F (36.7 C)     Temp Source 08/03/24 1257 Oral     SpO2 08/03/24 1257 98 %     Weight --      Height --      Head Circumference --      Peak Flow --      Pain Score 08/03/24 1300 7     Pain Loc --      Pain Education --      Exclude from Growth Chart --    No data found.  Updated Vital Signs BP 112/78 (BP Location: Right Arm)   Pulse 93   Temp 98 F (36.7 C) (Oral)   Resp 18   LMP 07/26/2024 (Exact Date)   SpO2 98%   Visual Acuity Right Eye Distance:   Left Eye Distance:   Bilateral Distance:    Right Eye Near:   Left Eye Near:    Bilateral Near:     Physical  Exam Constitutional:      Appearance: Normal appearance.  HENT:     Right Ear: Tympanic membrane is erythematous.     Left Ear: Tympanic membrane, ear canal and external ear normal.     Nose: Congestion present.  Pulmonary:     Effort: Pulmonary effort is normal.  Neurological:     Mental Status: She is alert and oriented to person, place, and time. Mental status is at baseline.      UC Treatments / Results  Labs (all labs ordered are listed, but only abnormal results are displayed) Labs Reviewed - No data to display  EKG   Radiology No results found.  Procedures Procedures (including critical care time)  Medications Ordered in UC Medications - No data to display  Initial Impression / Assessment and Plan / UC Course  I have reviewed the triage vital signs and the nursing notes.  Pertinent labs & imaging results that were available during my care of the patient were reviewed by me and considered in my medical decision making (see chart for details).  Nonrecurrent acute serous otitis media of the right ear  Erythema to the tympanic  membrane is consistent with infection, congestion to the nasal turbinates otherwise stable exam, prescribed cefdinir , advised against ear cleaning, may use over-the-counter analgesics and warm compresses to the external ear for comfort, may follow-up if symptoms persist worsen or recur  Final Clinical Impressions(s) / UC Diagnoses   Final diagnoses:  None   Discharge Instructions   None    ED Prescriptions   None    PDMP not reviewed this encounter.     [1]  Social History Tobacco Use   Smoking status: Never   Smokeless tobacco: Never  Vaping Use   Vaping status: Never Used  Substance Use Topics   Alcohol use: Yes   Drug use: Never     Teresa Shelba SAUNDERS, NP 08/03/24 1318  "

## 2024-08-10 ENCOUNTER — Ambulatory Visit
Admission: EM | Admit: 2024-08-10 | Discharge: 2024-08-10 | Disposition: A | Discharge status: Home / Self Care | Attending: Emergency Medicine | Admitting: Emergency Medicine

## 2024-08-10 ENCOUNTER — Encounter: Payer: Self-pay | Admitting: Emergency Medicine

## 2024-08-10 DIAGNOSIS — H6691 Otitis media, unspecified, right ear: Secondary | ICD-10-CM

## 2024-08-10 MED ORDER — DOXYCYCLINE HYCLATE 100 MG PO CAPS: 100.0000 mg | ORAL_CAPSULE | Freq: Two times a day (BID) | ORAL | 0 refills | Status: AC

## 2024-08-10 NOTE — ED Triage Notes (Signed)
 Pt was seen 08/03/24 for right ear pain and is not better. She completed the medication prescribed and states her hearing is muffled.

## 2024-08-10 NOTE — Discharge Instructions (Addendum)
 Take the doxycycline as directed for your ear infection.  Follow-up with your primary care provider.

## 2024-08-10 NOTE — ED Provider Notes (Signed)
 " CAY RALPH PELT    CSN: [id number] Arrival date & time: 08/10/24  1625      History   Chief Complaint Chief Complaint  Patient presents with   Otalgia    HPI Sharon Frye is a 24 y.o. female.  Patient presents with ongoing right ear pain x 1 week.  No fever, ear drainage, sore throat, cough, shortness of breath.  She reports URI symptoms at the onset of her ear pain but these have resolved.  Patient was seen at this urgent care on 08/03/2024; diagnosed with right otitis media; treated with cefdinir .  She completed the cefdinir  but the ear pain has not improved.  The history is provided by the patient and medical records.    Past Medical History:  Diagnosis Date   Anemia    Anxiety     Patient Active Problem List   Diagnosis Date Noted   Intercostal pain 06/21/2023   Encounter for medical examination to establish care 06/08/2023   Generalized anxiety disorder 06/08/2023   Iron deficiency anemia secondary to inadequate dietary iron intake 06/08/2023   Encounter for counseling regarding contraception 06/08/2023    History reviewed. No pertinent surgical history.  OB History   No obstetric history on file.      Home Medications    Prior to Admission medications  Medication Sig Start Date End Date Taking? Authorizing Provider  doxycycline (VIBRAMYCIN) 100 MG capsule Take 1 capsule (100 mg total) by mouth 2 (two) times daily for 7 days. 08/10/24 08/17/24 Yes Corlis Burnard DEL, NP  escitalopram  (LEXAPRO ) 10 MG tablet Take 1 tablet (10 mg total) by mouth daily. 07/16/24   Donzella Lauraine SAILOR, DO  hydrOXYzine  (ATARAX ) 10 MG tablet Take 1 tablet (10 mg total) by mouth 3 (three) times daily as needed. 07/16/24   Donzella Lauraine SAILOR, DO  Vitamin D , Ergocalciferol , (DRISDOL ) 1.25 MG (50000 UNIT) CAPS capsule Take 1 capsule (50,000 Units total) by mouth every 7 (seven) days. 07/28/24   Donzella Lauraine SAILOR, DO    Family History Family History  Problem Relation Age of Onset   Alcohol  abuse Mother    Depression Father    Drug abuse Father    Anxiety disorder Paternal Aunt    Miscarriages / Stillbirths Paternal Aunt     Social History Social History[1]   Allergies   Penicillins   Review of Systems Review of Systems  Constitutional:  Negative for chills and fever.  HENT:  Positive for ear pain. Negative for ear discharge and sore throat.   Respiratory:  Negative for cough and shortness of breath.      Physical Exam Triage Vital Signs ED Triage Vitals  Encounter Vitals Group     BP 08/10/24 1719 115/71     Girls Systolic BP Percentile --      Girls Diastolic BP Percentile --      Boys Systolic BP Percentile --      Boys Diastolic BP Percentile --      Pulse Rate 08/10/24 1719 70     Resp 08/10/24 1719 18     Temp 08/10/24 1719 98.2 F (36.8 C)     Temp Source 08/10/24 1719 Oral     SpO2 08/10/24 1719 97 %     Weight 08/10/24 1718 124 lb (56.2 kg)     Height --      Head Circumference --      Peak Flow --      Pain Score 08/10/24 1718 2  Pain Loc --      Pain Education --      Exclude from Growth Chart --    No data found.  Updated Vital Signs BP 115/71 (BP Location: Right Arm)   Pulse 70   Temp 98.2 F (36.8 C) (Oral)   Resp 18   Wt 124 lb (56.2 kg)   LMP 07/26/2024 (Exact Date)   SpO2 97%   BMI 21.28 kg/m   Visual Acuity Right Eye Distance:   Left Eye Distance:   Bilateral Distance:    Right Eye Near:   Left Eye Near:    Bilateral Near:     Physical Exam Constitutional:      General: She is not in acute distress. HENT:     Right Ear: Tympanic membrane is erythematous.     Left Ear: Tympanic membrane normal.     Nose: Nose normal.     Mouth/Throat:     Mouth: Mucous membranes are moist.     Pharynx: Oropharynx is clear.  Cardiovascular:     Rate and Rhythm: Normal rate and regular rhythm.     Heart sounds: Normal heart sounds.  Pulmonary:     Effort: Pulmonary effort is normal. No respiratory distress.     Breath  sounds: Normal breath sounds.  Neurological:     Mental Status: She is alert.      UC Treatments / Results  Labs (all labs ordered are listed, but only abnormal results are displayed) Labs Reviewed - No data to display  EKG   Radiology No results found.  Procedures Procedures (including critical care time)  Medications Ordered in UC Medications - No data to display  Initial Impression / Assessment and Plan / UC Course  I have reviewed the triage vital signs and the nursing notes.  Pertinent labs & imaging results that were available during my care of the patient were reviewed by me and considered in my medical decision making (see chart for details).    Right otitis media.  Afebrile and vital signs are stable.  Patient finished a 7-day course of cefdinir  without improvement.  Treating today with doxycycline.  Education provided on ear infection.  Instructed patient to follow-up with her PCP.  She agrees to plan of care.  Final Clinical Impressions(s) / UC Diagnoses   Final diagnoses:  Right otitis media, unspecified otitis media type     Discharge Instructions      Take the doxycycline as directed for your ear infection.  Follow-up with your primary care provider.     ED Prescriptions     Medication Sig Dispense Auth. Provider   doxycycline (VIBRAMYCIN) 100 MG capsule Take 1 capsule (100 mg total) by mouth 2 (two) times daily for 7 days. 14 capsule Corlis Burnard DEL, NP      PDMP not reviewed this encounter.    [1]  Social History Tobacco Use   Smoking status: Never   Smokeless tobacco: Never  Vaping Use   Vaping status: Never Used  Substance Use Topics   Alcohol use: Yes   Drug use: Never     Corlis Burnard DEL, NP 08/10/24 1748  "

## 2024-08-20 ENCOUNTER — Ambulatory Visit: Admitting: Psychology

## 2024-08-27 ENCOUNTER — Ambulatory Visit: Admitting: Family Medicine

## 2024-08-27 DIAGNOSIS — D508 Other iron deficiency anemias: Secondary | ICD-10-CM

## 2024-08-27 DIAGNOSIS — F411 Generalized anxiety disorder: Secondary | ICD-10-CM
